# Patient Record
Sex: Male | Born: 1985 | Race: Black or African American | Hispanic: No | Smoking: Never smoker
Health system: Southern US, Community
[De-identification: ages and names within clinical notes are randomized; demographics above are authoritative.]

## PROBLEM LIST (undated history)

## (undated) DIAGNOSIS — T7840XA Allergy, unspecified, initial encounter: Secondary | ICD-10-CM

## (undated) HISTORY — DX: Allergy, unspecified, initial encounter: T78.40XA

---

## 2012-06-14 ENCOUNTER — Ambulatory Visit (INDEPENDENT_AMBULATORY_CARE_PROVIDER_SITE_OTHER): Payer: 59 | Admitting: Internal Medicine

## 2012-06-14 VITALS — BP 148/97 | HR 87 | Temp 97.4°F | Resp 16 | Ht 73.0 in | Wt 311.0 lb

## 2012-06-14 DIAGNOSIS — J020 Streptococcal pharyngitis: Secondary | ICD-10-CM

## 2012-06-14 DIAGNOSIS — J039 Acute tonsillitis, unspecified: Secondary | ICD-10-CM

## 2012-06-14 MED ORDER — AMOXICILLIN 500 MG PO CAPS: 1000.0000 mg | ORAL_CAPSULE | Freq: Two times a day (BID) | ORAL | Status: DC

## 2012-06-14 NOTE — Progress Notes (Signed)
  Subjective:    Patient ID: Jack Rose, male    DOB: 13-Aug-1985, 27 y.o.   MRN: [id number]  HPI Has fever and st for 2 days. No chest sxs. Has 79 month old baby.   Review of Systems     Objective:   Physical Exam  Constitutional: He is oriented to person, place, and time. He appears well-developed and well-nourished.  HENT:  Right Ear: External ear normal.  Left Ear: External ear normal.  Nose: Nose normal.  Mouth/Throat: Oropharyngeal exudate present.  Eyes: EOM are normal.  Pulmonary/Chest: Effort normal.  Lymphadenopathy:    He has no cervical adenopathy.  Neurological: He is alert and oriented to person, place, and time.  Psychiatric: He has a normal mood and affect.     Results for orders placed in visit on 06/14/12  POCT RAPID STREP A (OFFICE)      Result Value Range   Rapid Strep A Screen Positive (*) Negative        Assessment & Plan:  Strep tonsillitis Amoxil 1g bid 10days Counseled

## 2012-06-14 NOTE — Patient Instructions (Addendum)

## 2012-06-15 ENCOUNTER — Telehealth: Payer: Self-pay

## 2012-06-15 NOTE — Telephone Encounter (Signed)
Pt would like a note from today through Next Tuesday he does not feel good at all please call him at [phone]

## 2012-06-15 NOTE — Telephone Encounter (Signed)
Note provided for him, he is advised.

## 2013-01-30 ENCOUNTER — Ambulatory Visit: Payer: Self-pay | Admitting: Family Medicine

## 2013-01-30 ENCOUNTER — Ambulatory Visit: Payer: Self-pay

## 2013-01-30 VITALS — BP 162/118 | HR 92 | Temp 98.0°F | Resp 16 | Ht 73.0 in | Wt 326.0 lb

## 2013-01-30 DIAGNOSIS — M79641 Pain in right hand: Secondary | ICD-10-CM

## 2013-01-30 DIAGNOSIS — S60229A Contusion of unspecified hand, initial encounter: Secondary | ICD-10-CM

## 2013-01-30 DIAGNOSIS — M79609 Pain in unspecified limb: Secondary | ICD-10-CM

## 2013-01-30 DIAGNOSIS — S60221A Contusion of right hand, initial encounter: Secondary | ICD-10-CM
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
## 2013-01-30 NOTE — Patient Instructions (Signed)
Hand Contusion  A hand contusion is a deep bruise on your hand area. Contusions are the result of an injury that caused bleeding under the skin. The contusion may turn blue, purple, or yellow. Minor injuries will give you a painless contusion, but more severe contusions may stay painful and swollen for a few weeks.  CAUSES   A contusion is usually caused by a blow, trauma, or direct force to an area of the body.  SYMPTOMS   · Swelling and redness of the injured area.  · Discoloration of the injured area.  · Tenderness and soreness of the injured area.  · Pain.  DIAGNOSIS   The diagnosis can be made by taking a history and performing a physical exam. An X-ray, CT scan, or MRI may be needed to determine if there were any associated injuries, such as broken bones (fractures).  TREATMENT   Often, the best treatment for a hand contusion is resting, elevating, icing, and applying cold compresses to the injured area. Over-the-counter medicines may also be recommended for pain control.  HOME CARE INSTRUCTIONS   · Put ice on the injured area.  · Put ice in a plastic bag.  · Place a towel between your skin and the bag.  · Leave the ice on for 15-20 minutes, 3-4 times a day.  · Only take over-the-counter or prescription medicines as directed by your caregiver. Your caregiver may recommend avoiding anti-inflammatory medicines (aspirin, ibuprofen, and naproxen) for 48 hours because these medicines may increase bruising.  · If told, use an elastic wrap as directed. This can help reduce swelling. You may remove the wrap for sleeping, showering, and bathing. If your fingers become numb, cold, or blue, take the wrap off and reapply it more loosely.  · Elevate your hand with pillows to reduce swelling.  · Avoid overusing your hand if it is painful.  SEEK IMMEDIATE MEDICAL CARE IF:   · You have increased redness, swelling, or pain in your hand.  · Your swelling or pain is not relieved with medicines.  · You have loss of feeling in  your hand or are unable to move your fingers.  · Your hand turns cold or blue.  · You have pain when you move your fingers.  · Your hand becomes warm to the touch.  · Your contusion does not improve in 2 days.  MAKE SURE YOU:   · Understand these instructions.  · Will watch your condition.  · Will get help right away if you are not doing well or get worse.  Document Released: 09/24/2001 Document Revised: 12/28/2011 Document Reviewed: 09/26/2011  ExitCare® Patient Information ©2014 [employer].

## 2013-07-20 ENCOUNTER — Ambulatory Visit: Payer: Self-pay | Admitting: Physician Assistant

## 2013-07-20 VITALS — BP 150/110 | HR 80 | Temp 98.2°F | Resp 20 | Ht 74.25 in | Wt 336.6 lb

## 2013-07-20 DIAGNOSIS — J069 Acute upper respiratory infection, unspecified: Secondary | ICD-10-CM

## 2013-07-20 DIAGNOSIS — B9789 Other viral agents as the cause of diseases classified elsewhere: Secondary | ICD-10-CM

## 2013-07-20 DIAGNOSIS — R0683 Snoring: Secondary | ICD-10-CM

## 2013-07-20 DIAGNOSIS — I10 Essential (primary) hypertension: Secondary | ICD-10-CM

## 2013-07-20 LAB — COMPLETE METABOLIC PANEL WITH GFR
ALT: 31 U/L (ref 0–53)
AST: 21 U/L (ref 0–37)
Albumin: 4.5 g/dL (ref 3.5–5.2)
Alkaline Phosphatase: 64 U/L (ref 39–117)
BUN: 8 mg/dL (ref 6–23)
CALCIUM: 9.9 mg/dL (ref 8.4–10.5)
CHLORIDE: 104 meq/L (ref 96–112)
CO2: 26 mEq/L (ref 19–32)
CREATININE: 1.27 mg/dL (ref 0.50–1.35)
GFR, Est African American: 88 mL/min
GFR, Est Non African American: 76 mL/min
Glucose, Bld: 87 mg/dL (ref 70–99)
POTASSIUM: 4.4 meq/L (ref 3.5–5.3)
Sodium: 139 mEq/L (ref 135–145)
Total Bilirubin: 0.5 mg/dL (ref 0.2–1.2)
Total Protein: 7.8 g/dL (ref 6.0–8.3)

## 2013-07-20 MED ORDER — HYDROCODONE-HOMATROPINE 5-1.5 MG/5ML PO SYRP: 5.0000 mL | ORAL_SOLUTION | Freq: Three times a day (TID) | ORAL | Status: DC | PRN

## 2013-07-20 MED ORDER — GUAIFENESIN ER 1200 MG PO TB12: 1.0000 | ORAL_TABLET | Freq: Two times a day (BID) | ORAL | Status: AC

## 2013-07-20 MED ORDER — HYDROCOD POLST-CHLORPHEN POLST 10-8 MG/5ML PO LQCR: 5.0000 mL | Freq: Two times a day (BID) | ORAL | Status: DC

## 2013-07-20 MED ORDER — TRIAMCINOLONE ACETONIDE 55 MCG/ACT NA AERO: 2.0000 | INHALATION_SPRAY | Freq: Every day | NASAL | Status: DC

## 2013-07-20 MED ORDER — LISINOPRIL-HYDROCHLOROTHIAZIDE 20-12.5 MG PO TABS: 1.0000 | ORAL_TABLET | Freq: Every day | ORAL | Status: DC
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
## 2013-07-20 NOTE — Patient Instructions (Signed)
Recheck with me in about 2 weeks.  I will see if I can get you an appt - they will call you for that appt - if I cannot see you in an appt they will give you my hours so you can f/u with me. We have done a referral for a sleep study to diagnosis possible sleep apnea.  I will contact you with your lab results as soon as they are available.   If you have not heard from me in 2 weeks, please contact me.  The fastest way to get your results is to register for My Chart (see the instructions on the last page of this printout).

## 2014-03-26 ENCOUNTER — Ambulatory Visit (INDEPENDENT_AMBULATORY_CARE_PROVIDER_SITE_OTHER): Payer: 59 | Admitting: Physician Assistant

## 2014-03-26 VITALS — BP 164/120 | HR 77 | Temp 98.5°F | Resp 18 | Ht 73.5 in | Wt 339.2 lb

## 2014-03-26 DIAGNOSIS — Z23 Encounter for immunization: Secondary | ICD-10-CM

## 2014-03-26 DIAGNOSIS — J069 Acute upper respiratory infection, unspecified: Secondary | ICD-10-CM

## 2014-03-26 DIAGNOSIS — I10 Essential (primary) hypertension: Secondary | ICD-10-CM

## 2014-03-26 MED ORDER — LISINOPRIL-HYDROCHLOROTHIAZIDE 20-12.5 MG PO TABS: 1.0000 | ORAL_TABLET | Freq: Every day | ORAL | Status: DC

## 2014-03-26 MED ORDER — IPRATROPIUM BROMIDE 0.03 % NA SOLN: 2.0000 | Freq: Two times a day (BID) | NASAL | Status: DC

## 2014-03-26 MED ORDER — MAGIC MOUTHWASH W/LIDOCAINE: 10.0000 mL | ORAL | Status: DC | PRN

## 2014-03-26 NOTE — Patient Instructions (Signed)
Upper Respiratory Infection, Adult An upper respiratory infection (URI) is also sometimes known as the common cold. The upper respiratory tract includes the nose, sinuses, throat, trachea, and bronchi. Bronchi are the airways leading to the lungs. Most people improve within 1 week, but symptoms can last up to 2 weeks. A residual cough may last even longer.  CAUSES Many different viruses can infect the tissues lining the upper respiratory tract. The tissues become irritated and inflamed and often become very moist. Mucus production is also common. A cold is contagious. You can easily spread the virus to others by oral contact. This includes kissing, sharing a glass, coughing, or sneezing. Touching your mouth or nose and then touching a surface, which is then touched by another person, can also spread the virus. SYMPTOMS  Symptoms typically develop 1 to 3 days after you come in contact with a cold virus. Symptoms vary from person to person. They may include:  Runny nose.  Sneezing.  Nasal congestion.  Sinus irritation.  Sore throat.  Loss of voice (laryngitis).  Cough.  Fatigue.  Muscle aches.  Loss of appetite.  Headache.  Low-grade fever. DIAGNOSIS  You might diagnose your own cold based on familiar symptoms, since most people get a cold 2 to 3 times a year. Your caregiver can confirm this based on your exam. Most importantly, your caregiver can check that your symptoms are not due to another disease such as strep throat, sinusitis, pneumonia, asthma, or epiglottitis. Blood tests, throat tests, and X-rays are not necessary to diagnose a common cold, but they may sometimes be helpful in excluding other more serious diseases. Your caregiver will decide if any further tests are required. RISKS AND COMPLICATIONS  You may be at risk for a more severe case of the common cold if you smoke cigarettes, have chronic heart disease (such as heart failure) or lung disease (such as asthma), or if  you have a weakened immune system. The very young and very old are also at risk for more serious infections. Bacterial sinusitis, middle ear infections, and bacterial pneumonia can complicate the common cold. The common cold can worsen asthma and chronic obstructive pulmonary disease (COPD). Sometimes, these complications can require emergency medical care and may be life-threatening. PREVENTION  The best way to protect against getting a cold is to practice good hygiene. Avoid oral or hand contact with people with cold symptoms. Wash your hands often if contact occurs. There is no clear evidence that vitamin C, vitamin E, echinacea, or exercise reduces the chance of developing a cold. However, it is always recommended to get plenty of rest and practice good nutrition. TREATMENT  Treatment is directed at relieving symptoms. There is no cure. Antibiotics are not effective, because the infection is caused by a virus, not by bacteria. Treatment may include:  Increased fluid intake. Sports drinks offer valuable electrolytes, sugars, and fluids.  Breathing heated mist or steam (vaporizer or shower).  Eating chicken soup or other clear broths, and maintaining good nutrition.  Getting plenty of rest.  Using gargles or lozenges for comfort.  Controlling fevers with ibuprofen or acetaminophen as directed by your caregiver.  Increasing usage of your inhaler if you have asthma. Zinc gel and zinc lozenges, taken in the first 24 hours of the common cold, can shorten the duration and lessen the severity of symptoms. Pain medicines may help with fever, muscle aches, and throat pain. A variety of non-prescription medicines are available to treat congestion and runny nose. Your caregiver   can make recommendations and may suggest nasal or lung inhalers for other symptoms.  HOME CARE INSTRUCTIONS   Only take over-the-counter or prescription medicines for pain, discomfort, or fever as directed by your  caregiver.  Use a warm mist humidifier or inhale steam from a shower to increase air moisture. This may keep secretions moist and make it easier to breathe.  Drink enough water and fluids to keep your urine clear or pale yellow.  Rest as needed.  Return to work when your temperature has returned to normal or as your caregiver advises. You may need to stay home longer to avoid infecting others. You can also use a face mask and careful hand washing to prevent spread of the virus. SEEK MEDICAL CARE IF:   After the first few days, you feel you are getting worse rather than better.  You need your caregiver's advice about medicines to control symptoms.  You develop chills, worsening shortness of breath, or brown or red sputum. These may be signs of pneumonia.  You develop yellow or brown nasal discharge or pain in the face, especially when you bend forward. These may be signs of sinusitis.  You develop a fever, swollen neck glands, pain with swallowing, or white areas in the back of your throat. These may be signs of strep throat. SEEK IMMEDIATE MEDICAL CARE IF:   You have a fever.  You develop severe or persistent headache, ear pain, sinus pain, or chest pain.  You develop wheezing, a prolonged cough, cough up blood, or have a change in your usual mucus (if you have chronic lung disease).  You develop sore muscles or a stiff neck. Document Released: 09/28/2000 Document Revised: 06/27/2011 Document Reviewed: 07/10/2013 ExitCare Patient Information 2015 [employer]. This information is not intended to replace advice given to you by your health care provider. Make sure you discuss any questions you have with your health care provider.  

## 2014-03-26 NOTE — Progress Notes (Signed)
I have discussed this case with Ms. Ricki Miller, PA-C and agree.

## 2014-03-26 NOTE — Progress Notes (Signed)
Subjective:    Patient ID: Jack Rose, male    DOB: [birth year], 28 y.o.   MRN: [id number]  HPI Patient presents with 6 days of productive cough with thick yellow sputum and sore throat. Cough is improving, however, throat feels worse. Sx are accompanied with rhinorrhea and congestion. Denies fever, sinus/ear pressure, or N/V. No h/o asthma and has seasonal allergies. Both girlfriend or child have similar sx. Has tried Theraflu and OTC cold meds with no relief. Denies allergy to any medication.  BP elevated in office. Patient unaware of usual BP readings. Has not taken BP meds since summertime and has not had follow up. No reason for not following up. Has had more headaches over past week, but denies changes in vision, SOB, CP, or edema.   Review of Systems  Constitutional: Positive for fatigue. Negative for fever, activity change and appetite change.  HENT: Positive for congestion, rhinorrhea and sore throat. Negative for ear discharge, ear pain, facial swelling, postnasal drip, sinus pressure, sneezing and trouble swallowing.   Eyes: Negative for pain, redness, itching and visual disturbance.  Respiratory: Positive for cough. Negative for chest tightness, shortness of breath and wheezing.   Cardiovascular: Negative for chest pain, palpitations and leg swelling.  Gastrointestinal: Negative for nausea and vomiting.  Allergic/Immunologic: Positive for environmental allergies. Negative for food allergies.  Neurological: Positive for headaches. Negative for dizziness, weakness and light-headedness.  Hematological: Positive for adenopathy.       Objective:   Physical Exam  Constitutional: He is oriented to person, place, and time. He appears well-developed and well-nourished. No distress.  Blood pressure 164/120, pulse 77, temperature 98.5 F (36.9 C), temperature source Oral, resp. rate 18, height 6' 1.5" (1.867 m), weight 339 lb 3.2 oz (153.86 kg), SpO2 98 %.   HENT:  Head:  Normocephalic and atraumatic.  Right Ear: Tympanic membrane, external ear and ear canal normal.  Left Ear: Tympanic membrane, external ear and ear canal normal.  Nose: Mucosal edema and rhinorrhea (mild erythema) present. No sinus tenderness. Right sinus exhibits no maxillary sinus tenderness and no frontal sinus tenderness. Left sinus exhibits no maxillary sinus tenderness and no frontal sinus tenderness.  Mouth/Throat: Uvula is midline, oropharynx is clear and moist and mucous membranes are normal. No oropharyngeal exudate, posterior oropharyngeal edema or posterior oropharyngeal erythema.  Eyes: Conjunctivae are normal. Pupils are equal, round, and reactive to light. Right eye exhibits no discharge. Left eye exhibits no discharge.  Neck: Normal range of motion. Neck supple. No JVD present. No thyromegaly present.  Cardiovascular: Normal rate, regular rhythm and normal heart sounds.  Exam reveals no gallop and no friction rub.   No murmur heard. Pulmonary/Chest: Effort normal and breath sounds normal. No stridor. No respiratory distress. He has no wheezes. He has no rales. He exhibits no tenderness.  Abdominal: Soft. Bowel sounds are normal. There is no tenderness.  Musculoskeletal: He exhibits no edema.  Lymphadenopathy:    He has cervical adenopathy.  Neurological: He is alert and oriented to person, place, and time.  Skin: Skin is warm and dry. No rash noted. He is not diaphoretic. No erythema. No pallor.       Assessment & Plan:  1. Acute upper respiratory infection - Alum & Mag Hydroxide-Simeth (MAGIC MOUTHWASH W/LIDOCAINE) SOLN; Take 10 mLs by mouth every 2 (two) hours as needed for mouth pain.  Dispense: 360 mL; Refill: 0 - ipratropium (ATROVENT) 0.03 % nasal spray; Place 2 sprays into both nostrils 2 (two) times daily.  Dispense: 30 mL; Refill: 0 - Use Mucinex and increase fluid intake.   2. Need for prophylactic vaccination and inoculation against influenza - Flu Vaccine QUAD 36+  mos IM  3. Essential hypertension Uncontrolled. - lisinopril-hydrochlorothiazide (ZESTORETIC) 20-12.5 MG per tablet; Take 1 tablet by mouth daily.  Dispense: 30 tablet; Refill: 1 - RTC in 4-6 weeks for BP recheck and complete physical.    Alveta Heimlich PA-C  [facility] 03/26/2014 2:43 PM

## 2014-06-20 ENCOUNTER — Other Ambulatory Visit: Payer: Self-pay | Admitting: Physician Assistant

## 2015-01-31 ENCOUNTER — Ambulatory Visit (INDEPENDENT_AMBULATORY_CARE_PROVIDER_SITE_OTHER): Payer: 59 | Admitting: Family Medicine

## 2015-01-31 VITALS — BP 140/110 | HR 79 | Temp 98.4°F | Resp 18 | Ht 73.0 in | Wt 331.5 lb

## 2015-01-31 DIAGNOSIS — H109 Unspecified conjunctivitis: Secondary | ICD-10-CM | POA: Diagnosis not present

## 2015-01-31 DIAGNOSIS — I10 Essential (primary) hypertension: Secondary | ICD-10-CM

## 2015-01-31 MED ORDER — TOBRAMYCIN 0.3 % OP SOLN: 1.0000 [drp] | Freq: Four times a day (QID) | OPHTHALMIC | Status: DC

## 2015-01-31 MED ORDER — LISINOPRIL-HYDROCHLOROTHIAZIDE 20-12.5 MG PO TABS: 1.0000 | ORAL_TABLET | Freq: Every day | ORAL | Status: DC

## 2015-01-31 NOTE — Patient Instructions (Signed)

## 2015-01-31 NOTE — Progress Notes (Signed)
@[facility] @  This chart was scribed for Robyn Haber, MD by Thea Alken, ED Scribe. This patient was seen in room 13 and the patient's care was started at 8:27 AM.  Patient ID: Jack Rose MRN: [id number], DOB: 04-11-1986, 29 y.o. Date of Encounter: 01/31/2015, 8:28 AM  Primary Physician: No primary care provider on file.  Chief Complaint:  Chief Complaint  Patient presents with  . Conjunctivitis    Possible pink eye-letf eye. started around 10 am yesterday. C/O itching & blurred vision    HPI: 29 y.o. year old male with history below presents with an erythematous left eye noticed 1 days ago. Pt states when he woke up yesterday morning his pillow scraped his left eye. He now has occasional teary discharge, slight itchiness and mild blurred vision. states he woke up this morning with crusting to left eye. He is unsure of foreign body but denies feeling as if there is something in left eye. He does not wear contacts. Pt works in [employer], which he states contains a lot of dust.  Pt is also requesting a refill of lisinopril- HCTZ. He has not checked his BP in a couple weeks.  Past Medical History  Diagnosis Date  . Allergy      Home Meds: Prior to Admission medications   Medication Sig Start Date End Date Taking? Authorizing Provider  Alum & Mag Hydroxide-Simeth (MAGIC MOUTHWASH W/LIDOCAINE) SOLN Take 10 mLs by mouth every 2 (two) hours as needed for mouth pain. Patient not taking: Reported on 01/31/2015 03/26/14   Tishira R Brewington, PA-C  HYDROcodone-homatropine (HYCODAN) 5-1.5 MG/5ML syrup Take 5 mLs by mouth every 8 (eight) hours as needed for cough. Patient not taking: Reported on 03/26/2014 07/20/13   Mancel Bale, PA-C  ipratropium (ATROVENT) 0.03 % nasal spray Place 2 sprays into both nostrils 2 (two) times daily. Patient not taking: Reported on 01/31/2015 03/26/14   Tishira R Brewington, PA-C  lisinopril-hydrochlorothiazide (PRINZIDE,ZESTORETIC) 20-12.5 MG per tablet TAKE 1  TABLET BY MOUTH DAILY. Patient not taking: Reported on 01/31/2015 06/20/14   Tishira R Brewington, PA-C  triamcinolone (NASACORT AQ) 55 MCG/ACT AERO nasal inhaler Place 2 sprays into the nose daily. Patient not taking: Reported on 03/26/2014 07/20/13   Mancel Bale, PA-C    Allergies: No Known Allergies  Social History   Social History  . Marital Status: Single    Spouse Name: N/A  . Number of Children: N/A  . Years of Education: N/A   Occupational History  . Not on file.   Social History Main Topics  . Smoking status: Never Smoker   . Smokeless tobacco: Never Used  . Alcohol Use: Yes  . Drug Use: No  . Sexual Activity: Not on file   Other Topics Concern  . Not on file   Social History Narrative     Review of Systems: Constitutional: negative for chills, fever, night sweats, weight changes, or fatigue  HEENT: negative for  hearing loss, congestion, rhinorrhea, ST, epistaxis, or sinus pressure Cardiovascular: negative for chest pain or palpitations Respiratory: negative for hemoptysis, wheezing, shortness of breath, or cough Abdominal: negative for abdominal pain, nausea, vomiting, diarrhea, or constipation Dermatological: negative for rash Neurologic: negative for headache, dizziness, or syncope All other systems reviewed and are otherwise negative with the exception to those above and in the HPI.   Physical Exam: Blood pressure 140/110, pulse 79, temperature 98.4 F (36.9 C), temperature source Oral, resp. rate 18, height 6\' 1"  (1.854 m), weight 331  lb 8 oz (150.367 kg), SpO2 97 %., Body mass index is 43.75 kg/(m^2). General: Well developed, well nourished, in no acute distress. Head: Normocephalic, atraumatic, eyes without discharge, sclera non-icteric, nares are without discharge. Bilateral auditory canals clear, TM's are without perforation, pearly grey and translucent with reflective cone of light bilaterally. Oral cavity moist, posterior pharynx without exudate,  erythema, peritonsillar abscess, or post nasal drip. Left eye- flurosciene is negative. No abrasion. Conjunctiva is diffusely injected with some edema and discharge   Neck: Supple. No thyromegaly. Full ROM. No lymphadenopathy. Lungs: Clear bilaterally to auscultation without wheezes, rales, or rhonchi. Breathing is unlabored. Heart: RRR with S1 S2. No murmurs, rubs, or gallops appreciated. Msk:  Strength and tone normal for age. Extremities/Skin: Warm and dry. No clubbing or cyanosis. No edema. No rashes or suspicious lesions. Neuro: Alert and oriented X 3. Moves all extremities spontaneously. Gait is normal. CNII-XII grossly in tact. Psych:  Responds to questions appropriately with a normal affect.    ASSESSMENT AND PLAN:  29 y.o. year old male with left injected eye  By signing my name below, I, Raven Small, attest that this documentation has been prepared under the direction and in the presence of Robyn Haber, MD.  Electronically Signed: Thea Alken, ED Scribe. 01/31/2015. 8:38 AM. This chart was scribed in my presence and reviewed by me personally.    ICD-9-CM ICD-10-CM   1. Conjunctivitis of left eye 372.30 H10.9 tobramycin (TOBREX) 0.3 % ophthalmic solution  2. Essential hypertension 401.9 I10 lisinopril-hydrochlorothiazide (PRINZIDE,ZESTORETIC) 20-12.5 MG tablet     Signed, Robyn Haber, MD  Signed, Robyn Haber, MD 01/31/2015 8:28 AM

## 2015-05-08 ENCOUNTER — Ambulatory Visit (INDEPENDENT_AMBULATORY_CARE_PROVIDER_SITE_OTHER): Payer: 59 | Admitting: Physician Assistant

## 2015-05-08 VITALS — BP 139/90 | HR 70 | Temp 98.0°F | Resp 16 | Ht 73.5 in | Wt 333.4 lb

## 2015-05-08 DIAGNOSIS — M79671 Pain in right foot: Secondary | ICD-10-CM

## 2015-05-08 DIAGNOSIS — Z114 Encounter for screening for human immunodeficiency virus [HIV]: Secondary | ICD-10-CM

## 2015-05-08 DIAGNOSIS — R0683 Snoring: Secondary | ICD-10-CM

## 2015-05-08 DIAGNOSIS — Z23 Encounter for immunization: Secondary | ICD-10-CM | POA: Diagnosis not present

## 2015-05-08 DIAGNOSIS — I10 Essential (primary) hypertension: Secondary | ICD-10-CM | POA: Diagnosis not present

## 2015-05-08 DIAGNOSIS — K1370 Unspecified lesions of oral mucosa: Secondary | ICD-10-CM | POA: Diagnosis not present

## 2015-05-08 DIAGNOSIS — E66813 Obesity, class 3: Secondary | ICD-10-CM

## 2015-05-08 LAB — COMPLETE METABOLIC PANEL WITH GFR
ALT: 25 U/L (ref 9–46)
AST: 19 U/L (ref 10–40)
Albumin: 4.7 g/dL (ref 3.6–5.1)
Alkaline Phosphatase: 63 U/L (ref 40–115)
BUN: 12 mg/dL (ref 7–25)
CALCIUM: 9.9 mg/dL (ref 8.6–10.3)
CO2: 28 mmol/L (ref 20–31)
Chloride: 101 mmol/L (ref 98–110)
Creat: 1.36 mg/dL — ABNORMAL HIGH (ref 0.60–1.35)
GFR, EST NON AFRICAN AMERICAN: 69 mL/min (ref >=60)
GFR, Est African American: 80 mL/min (ref >=60)
Glucose, Bld: 97 mg/dL (ref 65–99)
POTASSIUM: 4.3 mmol/L (ref 3.5–5.3)
Sodium: 140 mmol/L (ref 135–146)
Total Bilirubin: 0.5 mg/dL (ref 0.2–1.2)
Total Protein: 8 g/dL (ref 6.1–8.1)

## 2015-05-08 LAB — POCT GLYCOSYLATED HEMOGLOBIN (HGB A1C): HEMOGLOBIN A1C: 5.9

## 2015-05-08 MED ORDER — AMLODIPINE BESYLATE 10 MG PO TABS: 10.0000 mg | ORAL_TABLET | Freq: Every day | ORAL | Status: DC

## 2015-05-08 MED ORDER — LISINOPRIL-HYDROCHLOROTHIAZIDE 20-12.5 MG PO TABS: 1.0000 | ORAL_TABLET | Freq: Every day | ORAL | Status: DC

## 2015-05-08 MED ORDER — BD ASSURE BPM/AUTO WRIST CUFF MISC: 1.0000 | Freq: Every day | Status: AC

## 2015-05-08 NOTE — Patient Instructions (Signed)
I will contact you with your lab results as soon as they are available.   If you have not heard from me in 2 weeks, please contact me.  The fastest way to get your results is to register for My Chart (see the instructions on the last page of this printout).   

## 2015-05-08 NOTE — Progress Notes (Signed)
Jack Rose  MRN: [id number] DOB: [birth year]  Subjective:  Pt presents to clinic for recheck of his medical problems 1- needs refills of his HTN medications - he does not check his BP at home 2- he never for a call about his sleep study - he has over the last month been waking up with headaches 3- wants to make sure he does not have DM - he had some foot pain last night without injury and when he googled it DM was mentioned - he has noticed he has been more thirsty than normal but otherwise he has no other DM symptoms - he also had an episode of dizziness last week that lasted for only a short while and then resolved - it was after he had not eaten in a long time and then ate a sweet bun and he developed feeling like he was going to pass out but never did and then in a short time it resolved - he has not had it since 4- feels like there is something in his throat over the last week that will not go away - he has no pain and no trouble swallowing and no change in his voice  Patient Active Problem List   Diagnosis Date Noted  . Obesity, Class III, BMI 40-49.9 (morbid obesity) ([place]) 05/08/2015  . Benign essential HTN 05/08/2015    No current outpatient prescriptions on file prior to visit.   No current facility-administered medications on file prior to visit.    No Known Allergies  Review of Systems  Constitutional: Negative for fever and chills.  HENT: Negative for congestion and sore throat.   Musculoskeletal: Negative for gait problem.  Neurological: Positive for headaches.   Objective:  BP 139/90 mmHg  Pulse 70  Temp(Src) 98 F (36.7 C) (Oral)  Resp 16  Ht 6' 1.5" (1.867 m)  Wt 333 lb 6.4 oz (151.229 kg)  BMI 43.39 kg/m2  SpO2 98%  Physical Exam  Constitutional: He is oriented to person, place, and time and well-developed, well-nourished, and in no distress.     HENT:  Head: Normocephalic and atraumatic.  Right Ear: Hearing, tympanic membrane, external ear and ear  canal normal.  Left Ear: Hearing, tympanic membrane, external ear and ear canal normal.  Nose: Nose normal.  Mouth/Throat: Uvula is midline, oropharynx is clear and moist and mucous membranes are normal. Oral lesions (right lower part of uvula - warty aoppearing lesion ~1/2cm in size) present.  Eyes: Conjunctivae are normal.  Neck: Normal range of motion.  Cardiovascular: Normal rate, regular rhythm, normal heart sounds and intact distal pulses.   Pulmonary/Chest: Effort normal and breath sounds normal. He has no wheezes.  Musculoskeletal:       Right lower leg: He exhibits no edema.       Left lower leg: He exhibits no edema.       Right foot: There is tenderness (anterior ankle). There is normal range of motion.  Feeling of stretching with dorsiflexion  Lymphadenopathy:       Head (right side): No tonsillar adenopathy present.       Head (left side): No tonsillar adenopathy present.    He has no cervical adenopathy.       Right: No supraclavicular adenopathy present.       Left: No supraclavicular adenopathy present.  Neurological: He is alert and oriented to person, place, and time. He has normal sensation, normal strength and normal reflexes. Gait normal. Gait normal.  Skin: Skin  is warm and dry.  Psychiatric: Mood, memory, affect and judgment normal.    Assessment and Plan :  Snoring - Plan: Nocturnal polysomnography (NPSG) - I am unsure why the sleep study was never ordered but I have ordered it today - I think it is suspect for sleep apnea needing CPAP   Essential hypertension - Plan: amLODipine (NORVASC) 10 MG tablet, Care order/instruction, Care order/instruction, lisinopril-hydrochlorothiazide (PRINZIDE,ZESTORETIC) 20-12.5 MG tablet,  - uncontrolled - we will add norvasc but I suspect patient has sleep apnea that once that is treated his BP might be more controlled  Obesity, Class III, BMI 40-49.9 (morbid obesity) (HCC) - Plan: POCT glycosylated hemoglobin (Hb A1C) - no sign of  DM today -   Benign essential HTN - Plan: Nocturnal polysomnography (NPSG), COMPLETE METABOLIC PANEL WITH GFR, TSH  Flu vaccine need - Plan: Flu Vaccine QUAD 36+ mos IM  Need for Tdap vaccination - Plan: Tdap vaccine greater than or equal to 7yo IM  Lesion of uvula - appears to be a wart - Plan: Ambulatory referral to ENT  Screening for HIV (human immunodeficiency virus) - Plan: HIV antibody  Foot pain, right - this is likely to be a strain - he will watch and wait and use OTC NSAIDs  Windell Hummingbird PA-C  [facility] 05/08/2015 1:43 PM

## 2015-05-09 ENCOUNTER — Encounter: Payer: Self-pay | Admitting: Physician Assistant

## 2015-05-09 LAB — HIV ANTIBODY (ROUTINE TESTING W REFLEX): HIV 1&2 Ab, 4th Generation: NONREACTIVE

## 2015-05-09 LAB — TSH: TSH: 1.336 u[IU]/mL (ref 0.350–4.500)

## 2015-05-23 IMAGING — CR DG HAND COMPLETE 3+V*R*
3 series · 3 of 3 positions shown · non-contrast
Comparison: None.

CLINICAL DATA: Trauma and pain.

EXAM:
RIGHT HAND - COMPLETE 3+ VIEW

[PA]
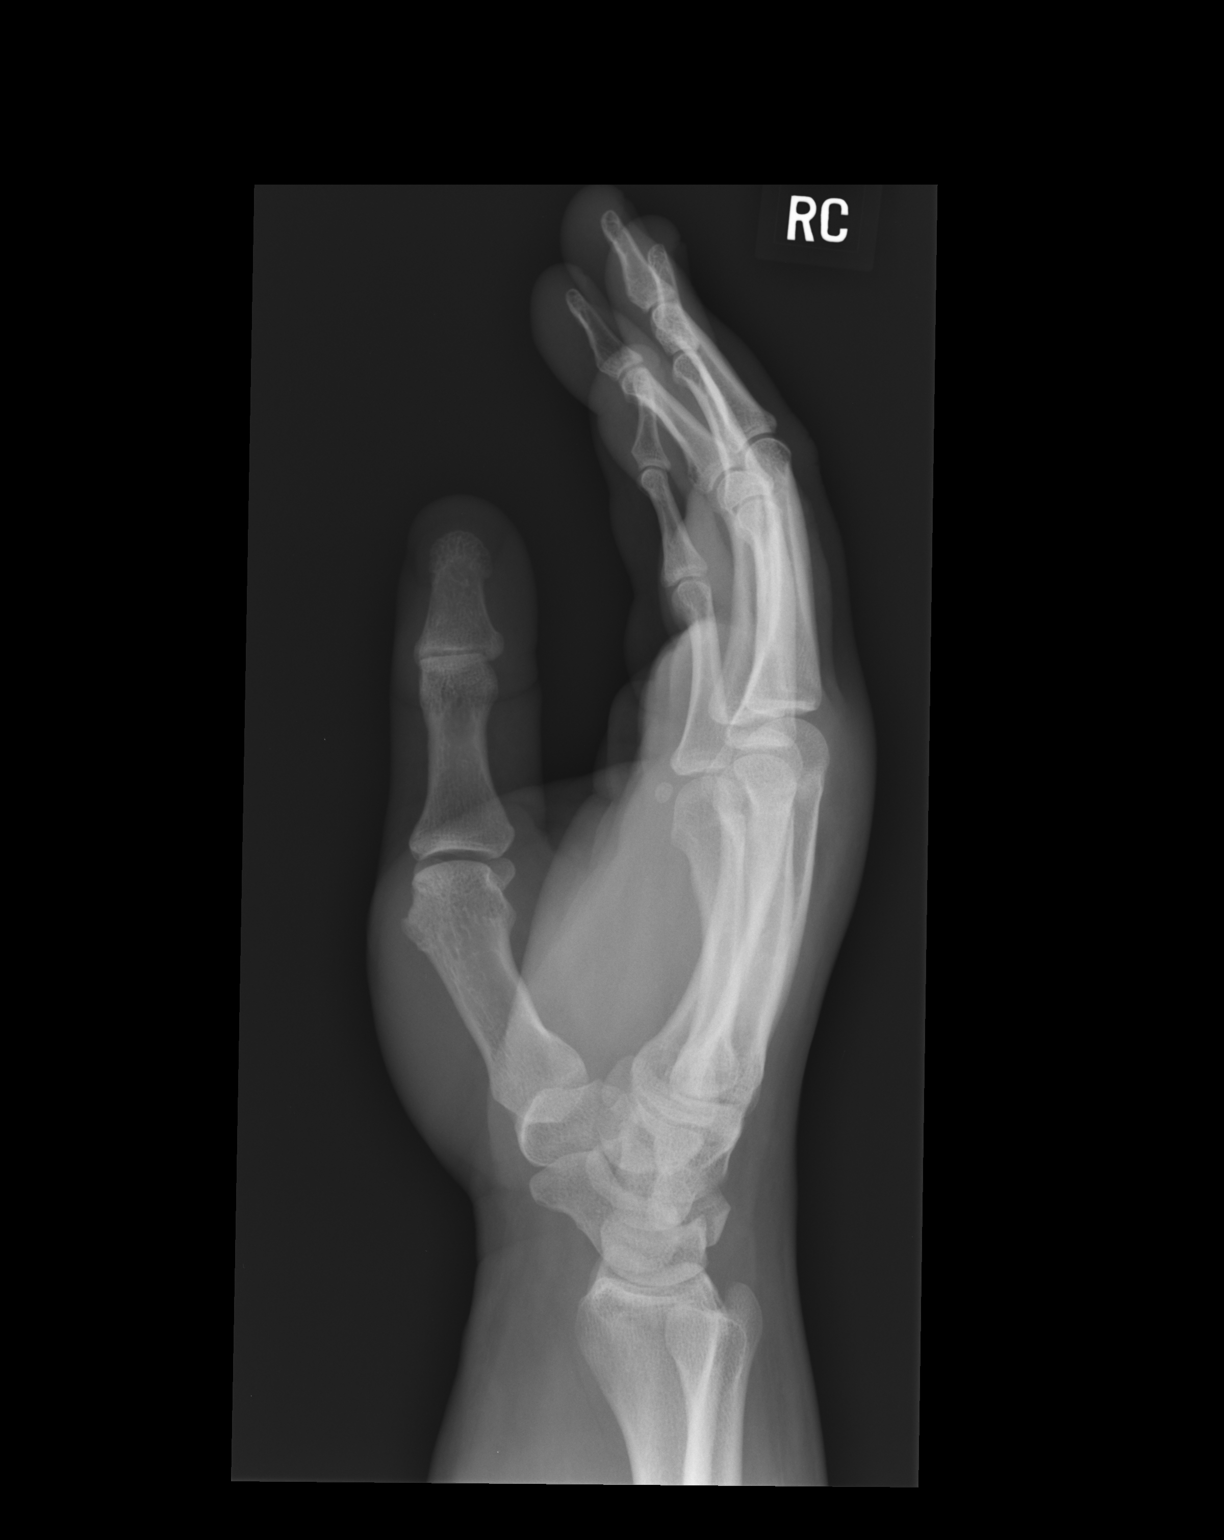

[lateral]
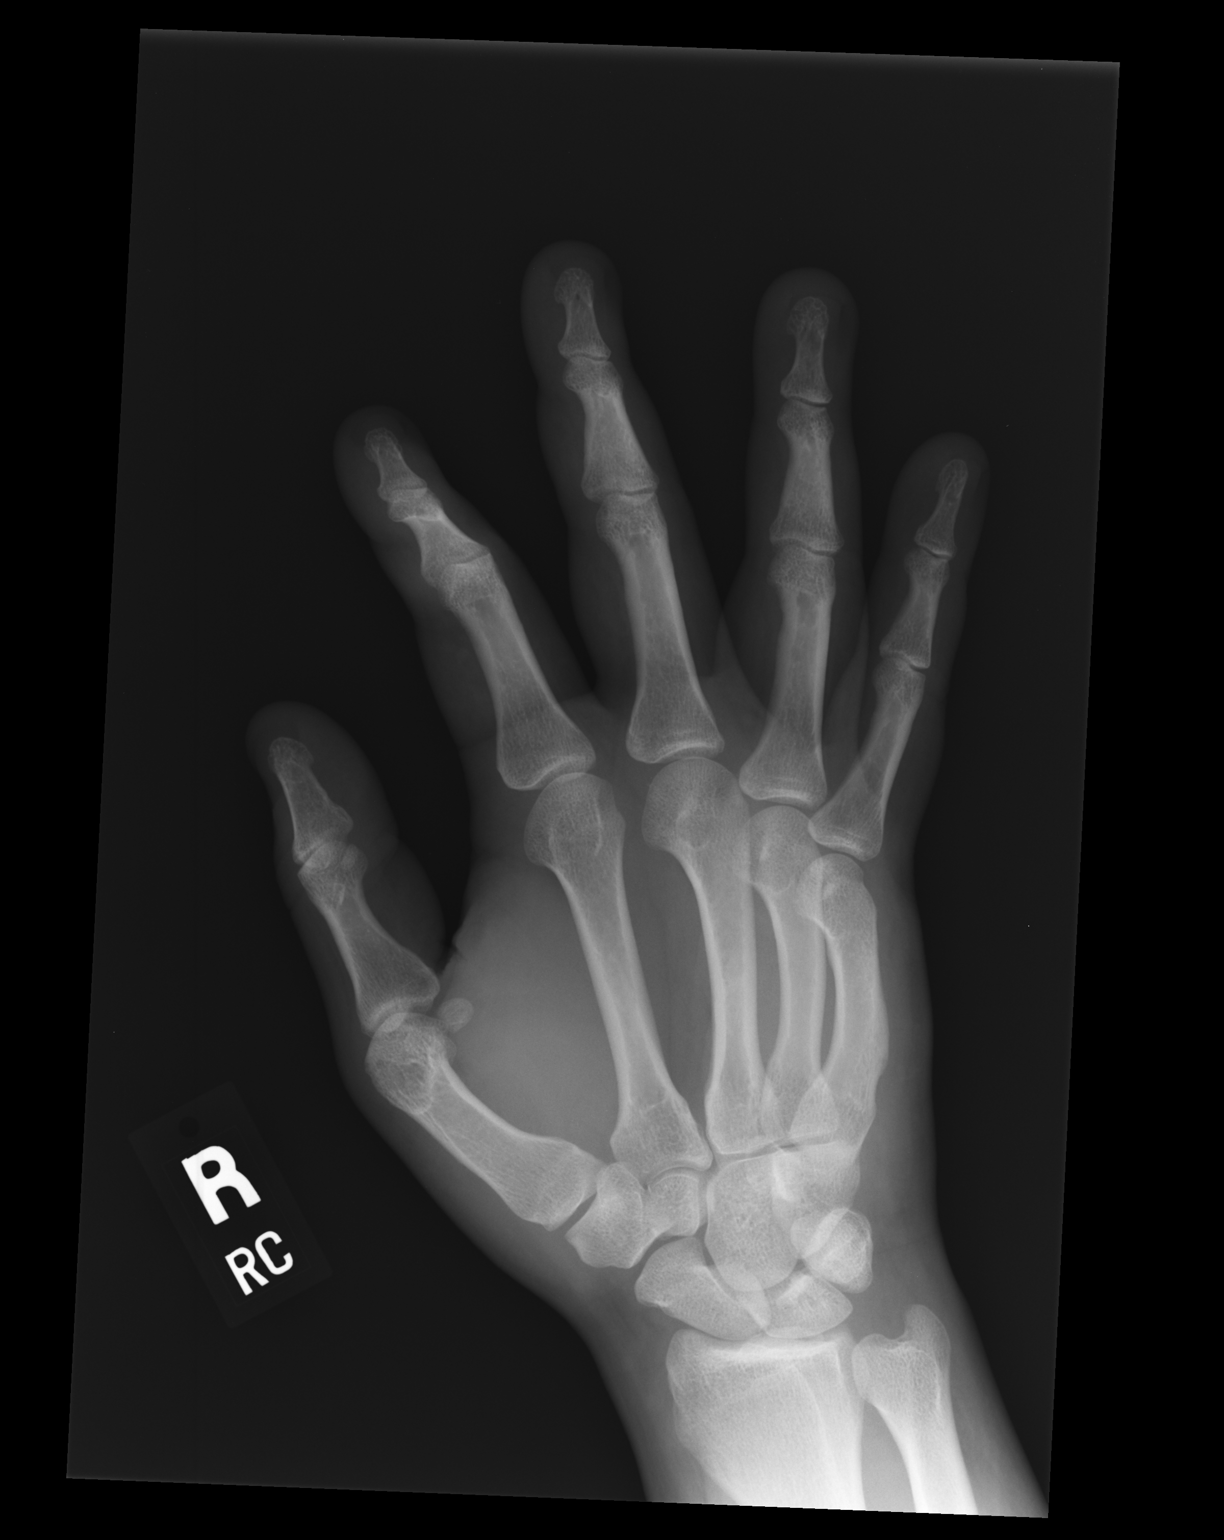

[pa obl]
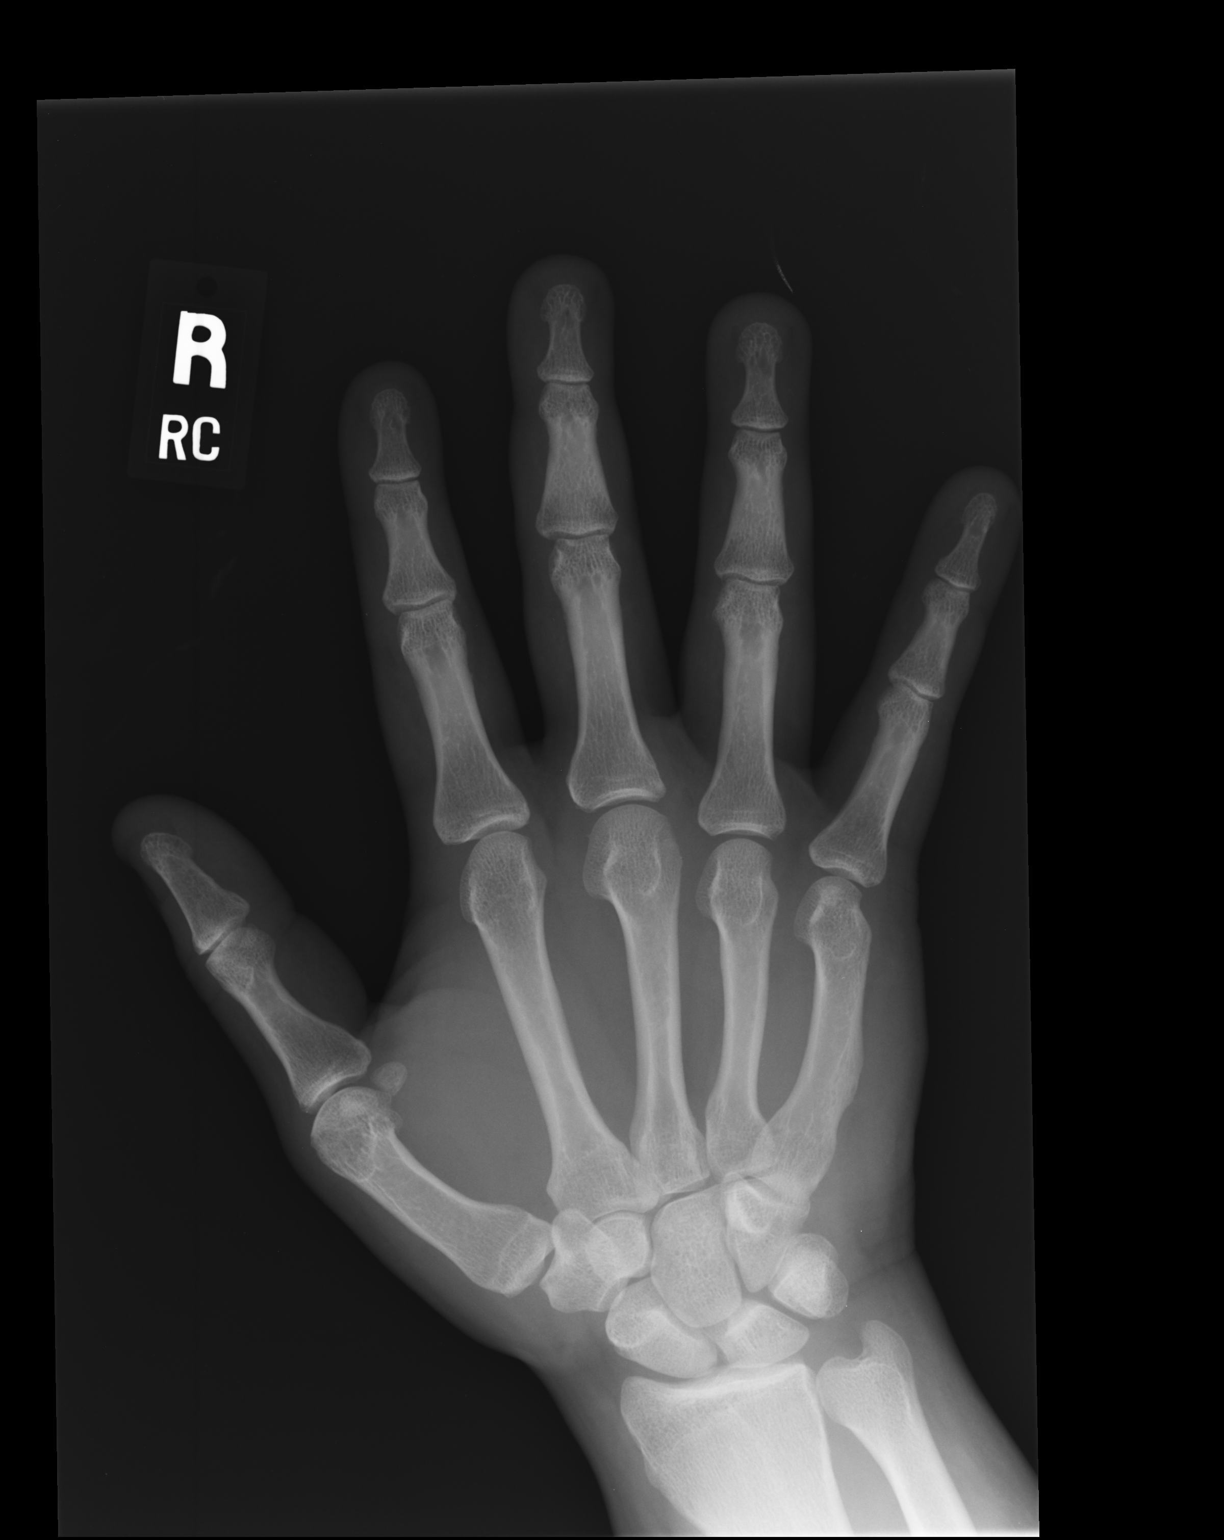

[3 of 3 positions shown; findings below may reference images not displayed]

FINDINGS: The lateral view is mildly degraded secondary to overlap of fingers.
Dorsal soft tissue swelling is identified at the level of the distal
metacarpals.

There is a deformity of the 5th metacarpal, most consistent with
remote trauma. No acute fracture or dislocation.
IMPRESSION: Dorsal soft tissue swelling, without acute osseous abnormality.

Remote trauma to the 5th metacarpal.

## 2015-06-29 DIAGNOSIS — D105 Benign neoplasm of other parts of oropharynx: Secondary | ICD-10-CM | POA: Insufficient documentation

## 2015-08-13 ENCOUNTER — Other Ambulatory Visit: Payer: Self-pay | Admitting: Physician Assistant

## 2015-11-24 ENCOUNTER — Other Ambulatory Visit: Payer: Self-pay | Admitting: Physician Assistant

## 2016-04-01 ENCOUNTER — Other Ambulatory Visit: Payer: Self-pay | Admitting: Physician Assistant

## 2016-05-14 ENCOUNTER — Ambulatory Visit (INDEPENDENT_AMBULATORY_CARE_PROVIDER_SITE_OTHER): Payer: 59 | Admitting: Urgent Care

## 2016-05-14 VITALS — BP 136/88 | HR 86 | Temp 98.5°F | Resp 18 | Ht 73.5 in | Wt 337.0 lb

## 2016-05-14 DIAGNOSIS — R7303 Prediabetes: Secondary | ICD-10-CM | POA: Diagnosis not present

## 2016-05-14 DIAGNOSIS — E669 Obesity, unspecified: Secondary | ICD-10-CM | POA: Diagnosis not present

## 2016-05-14 DIAGNOSIS — R21 Rash and other nonspecific skin eruption: Secondary | ICD-10-CM | POA: Diagnosis not present

## 2016-05-14 LAB — GLUCOSE, POCT (MANUAL RESULT ENTRY): POC GLUCOSE: 103 mg/dL — AB (ref 70–99)

## 2016-05-14 MED ORDER — PREDNISONE 20 MG PO TABS: ORAL_TABLET | ORAL | 0 refills | Status: DC

## 2016-05-14 NOTE — Patient Instructions (Addendum)
Rash A rash is a change in the color of the skin. A rash can also change the way your skin feels. There are many different conditions and factors that can cause a rash. Follow these instructions at home: Pay attention to any changes in your symptoms. Follow these instructions to help with your condition: Medicine Take or apply over-the-counter and prescription medicines only as told by your health care provider. These may include:  Corticosteroid cream.  Anti-itch lotions.  Oral antihistamines.  Skin Care  Apply cool compresses to the affected areas.  Try taking a bath with: ? Epsom salts. Follow the instructions on the packaging. You can get these at your local pharmacy or grocery store. ? Baking soda. Pour a small amount into the bath as told by your health care provider. ? Colloidal oatmeal. Follow the instructions on the packaging. You can get this at your local pharmacy or grocery store.  Try applying baking soda paste to your skin. Stir water into baking soda until it reaches a paste-like consistency.  Do not scratch or rub your skin.  Avoid covering the rash. Make sure the rash is exposed to air as much as possible. General instructions  Avoid hot showers or baths, which can make itching worse. A cold shower may help.  Avoid scented soaps, detergents, and perfumes. Use gentle soaps, detergents, perfumes, and other cosmetic products.  Avoid any substance that causes your rash. Keep a journal to help track what causes your rash. Write down: ? What you eat. ? What cosmetic products you use. ? What you drink. ? What you wear. This includes jewelry.  Keep all follow-up visits as told by your health care provider. This is important. Contact a health care provider if:  You sweat at night.  You lose weight.  You urinate more than normal.  You feel weak.  You vomit.  Your skin or the whites of your eyes look yellow (jaundice).  Your skin: ? Tingles. ? Is  numb.  Your rash: ? Does not go away after several days. ? Gets worse.  You are: ? Unusually thirsty. ? More tired than normal.  You have: ? New symptoms. ? Pain in your abdomen. ? A fever. ? Diarrhea. Get help right away if:  You develop a rash that covers all or most of your body. The rash may or may not be painful.  You develop blisters that: ? Are on top of the rash. ? Grow larger or grow together. ? Are painful. ? Are inside your nose or mouth.  You develop a rash that: ? Looks like purple pinprick-sized spots all over your body. ? Has a "bull's eye" or looks like a target. ? Is not related to sun exposure, is red and painful, and causes your skin to peel. This information is not intended to replace advice given to you by your health care provider. Make sure you discuss any questions you have with your health care provider. Document Released: 03/25/2002 Document Revised: 09/08/2015 Document Reviewed: 08/20/2014 Elsevier Interactive Patient Education  2017 Elsevier Inc.     IF you received an x-ray today, you will receive an invoice from [facility]. Please contact [facility] at [phone] with questions or concerns regarding your invoice.   IF you received labwork today, you will receive an invoice from [facility]. Please contact [facility] at [phone] with questions or concerns regarding your invoice.   Our billing staff will not be able to assist you with questions regarding bills from these   companies.  You will be contacted with the lab results as soon as they are available. The fastest way to get your results is to activate your My Chart account. Instructions are located on the last page of this paperwork. If you have not heard from [place] regarding the results in 2 weeks, please contact this office.

## 2016-05-14 NOTE — Progress Notes (Signed)
    MRN: [id number] DOB: 1985/11/18  Subjective:   Jack Rose is a 31 y.o. male presenting for chief complaint of Rash (INNER THIGHS AND LEGS AND BACK)  Reports 2 week history of worsening rash over his inner thighs. Has been using steroid cream with some improvement but now has rash spreading over his left calf and back. The rash is primarily itchy, worse at night. Denies fever, sore throat, chest pain, n/v, abdominal pain, genital rash, dysuria. Has not started new medications, foods, switched hygiene products, cleaning agents, spent significant time outdoors. He did just move into a newly built home. Patient has a history of pre-diabetes but denies history of liver disease. HIV test in 04/2015 was negative.  Jack Rose has a current medication list which includes the following prescription(s): b-d assure bpm/auto wrist cuff, lisinopril-hydrochlorothiazide, and amlodipine. Also has No Known Allergies.  Jack Rose  has a past medical history of Allergy. Also denies past surgical history.   Objective:   Vitals: BP 136/88 (BP Location: Right Arm, Patient Position: Sitting, Cuff Size: Large)   Pulse 86   Temp 98.5 F (36.9 C) (Oral)   Resp 18   Ht 6' 1.5" (1.867 m)   Wt (!) 337 lb (152.9 kg)   SpO2 98%   BMI 43.86 kg/m   Physical Exam  Constitutional: He is oriented to person, place, and time. He appears well-developed and well-nourished.  HENT:  Mouth/Throat: Oropharynx is clear and moist.  No facial swelling.  Eyes: Right eye exhibits no discharge. Left eye exhibits no discharge. No scleral icterus.  Neck: Normal range of motion. Neck supple.  Cardiovascular: Normal rate, regular rhythm and intact distal pulses.  Exam reveals no gallop and no friction rub.   No murmur heard. Pulmonary/Chest: Effort normal. No respiratory distress. He has no wheezes. He has no rales.  Lymphadenopathy:    He has no cervical adenopathy.  Neurological: He is alert and oriented to person, place, and time.    Skin: Skin is warm and dry. Rash (large patches of scaly pruritic hyperpigmented rash over thighs, left calf and upper back) noted.   Results for orders placed or performed in visit on 05/14/16 (from the past 24 hour(s))  POCT glucose (manual entry)     Status: Abnormal   Collection Time: 05/14/16  3:46 PM  Result Value Ref Range   POC Glucose 103 (A) 70 - 99 mg/dl    Assessment and Plan :   This case was precepted with Dr. Mitchel Honour.   1. Rash and nonspecific skin eruption - Will manage as an allergic dermatitis to unknown offending agent. Patient is to start 9 day course of steroids. RTC if rash worsens, patient is to stop prednisone if rash worsens.  2. Obesity, unspecified classification, unspecified obesity type, unspecified whether serious comorbidity present 3. Pre-diabetes - Stable, checked for the steroid course we were prescribing.  Jaynee Eagles, PA-C [facility] [id number] 05/14/2016  3:30 PM

## 2016-06-13 ENCOUNTER — Telehealth: Payer: Self-pay

## 2016-06-13 NOTE — Telephone Encounter (Signed)
lvm to reschedule pt for tomorrow 06/14/16

## 2016-06-14 ENCOUNTER — Ambulatory Visit (INDEPENDENT_AMBULATORY_CARE_PROVIDER_SITE_OTHER): Payer: 59 | Admitting: Physician Assistant

## 2016-06-14 ENCOUNTER — Ambulatory Visit: Payer: 59

## 2016-06-14 ENCOUNTER — Encounter: Payer: Self-pay | Admitting: Physician Assistant

## 2016-06-14 VITALS — BP 150/100 | HR 86 | Temp 98.7°F | Resp 16 | Ht 73.0 in | Wt 335.0 lb

## 2016-06-14 DIAGNOSIS — B86 Scabies: Secondary | ICD-10-CM | POA: Diagnosis not present

## 2016-06-14 DIAGNOSIS — I10 Essential (primary) hypertension: Secondary | ICD-10-CM

## 2016-06-14 MED ORDER — AMLODIPINE BESYLATE 10 MG PO TABS: 10.0000 mg | ORAL_TABLET | Freq: Every day | ORAL | 0 refills | Status: DC

## 2016-06-14 MED ORDER — LISINOPRIL-HYDROCHLOROTHIAZIDE 20-12.5 MG PO TABS: 1.0000 | ORAL_TABLET | Freq: Every day | ORAL | 0 refills | Status: DC

## 2016-06-14 MED ORDER — AMLODIPINE BESYLATE 10 MG PO TABS: 10.0000 mg | ORAL_TABLET | Freq: Every day | ORAL | 1 refills | Status: DC

## 2016-06-14 MED ORDER — IVERMECTIN 3 MG PO TABS: ORAL_TABLET | ORAL | 0 refills | Status: DC

## 2016-06-14 MED ORDER — LISINOPRIL-HYDROCHLOROTHIAZIDE 20-12.5 MG PO TABS: 1.0000 | ORAL_TABLET | Freq: Every day | ORAL | 1 refills | Status: DC

## 2016-06-14 NOTE — Progress Notes (Signed)
   Jack Rose  MRN: [id number] DOB: February 12, 1986  Subjective:  Pt presents to clinic with concern for scabies.  Fiance was diagnosed yesterday with scabies.  He was treated with prednisone but that did not seem to make a difference - still continuing to get new spots.  Very itchy.  Ran out of his BP medications within the last couple of weeks he has had decreased consistency with his medication use.  Does not check his BP at home.  Review of Systems  Respiratory: Negative for cough and shortness of breath.   Cardiovascular: Negative for chest pain, palpitations and leg swelling.    Patient Active Problem List   Diagnosis Date Noted  . Obesity, Class III, BMI 40-49.9 (morbid obesity) ([place]) 05/08/2015  . Benign essential HTN 05/08/2015    Current Outpatient Prescriptions on File Prior to Visit  Medication Sig Dispense Refill  . Blood Pressure Monitoring (B-D ASSURE BPM/AUTO WRIST CUFF) MISC 1 Device by Does not apply route daily. 1 each 0   No current facility-administered medications on file prior to visit.     No Known Allergies  Pt patients past, family and social history were reviewed and updated.   Objective:  BP (!) 150/100   Pulse 86   Temp 98.7 F (37.1 C) (Oral)   Resp 16   Ht '6\' 1"'$  (1.854 m)   Wt (!) 335 lb (152 kg)   SpO2 98%   BMI 44.20 kg/m   Physical Exam  Constitutional: He is oriented to person, place, and time and well-developed, well-nourished, and in no distress.  HENT:  Head: Normocephalic and atraumatic.  Right Ear: External ear normal.  Left Ear: External ear normal.  Eyes: Conjunctivae are normal.  Neck: Normal range of motion.  Cardiovascular: Normal rate, regular rhythm, normal heart sounds and intact distal pulses.   Pulmonary/Chest: Effort normal and breath sounds normal. He has no wheezes.  Musculoskeletal:       Right lower leg: He exhibits no edema.       Left lower leg: He exhibits no edema.  Neurological: He is alert and oriented to  person, place, and time. Gait normal.  Skin: Skin is warm and dry.  Interdigital space - multiple papules that are excoriated with surrounding dry skin- some channeled - bilateral thighs hyperpigmented dry spots - seem to be healing skin - no erythema present  Psychiatric: Mood, memory, affect and judgment normal.    Assessment and Plan :  Scabies - Plan: ivermectin (STROMECTOL) 3 MG TABS tablet - d/w pt how to treat his house and other family members were treated  Essential hypertension - Plan: CMP14+EGFR, lisinopril-hydrochlorothiazide (PRINZIDE,ZESTORETIC) 20-12.5 MG tablet, amLODipine (NORVASC) 10 MG tablet, DISCONTINUED: lisinopril-hydrochlorothiazide (PRINZIDE,ZESTORETIC) 20-12.5 MG tablet, DISCONTINUED: amLODipine (NORVASC) 10 MG tablet - d/w pt that consistent use of medication is important - he was seen last month while on the medications and his BP was controlled at that visit.  Windell Hummingbird PA-C  [facility] 06/14/2016 9:40 PM

## 2016-06-14 NOTE — Patient Instructions (Addendum)
Scabies, Adult Scabies is a skin condition that happens when very small insects get under the skin (infestation). This causes a rash and severe itchiness. Scabies can spread from person to person (is contagious). If you get scabies, it is common for others in your household to get scabies too. With proper treatment, symptoms usually go away in 2-4 weeks. Scabies usually does not cause lasting problems. What are the causes? This condition is caused by mites (Sarcoptes scabiei, or human itch mites) that can only be seen with a microscope. The mites get into the top layer of skin and lay eggs. Scabies can spread from person to person through:  Close contact with a person who has scabies.  Contact with infested items, such as towels, bedding, or clothing. What increases the risk? This condition is more likely to develop in:  People who live in nursing homes and other extended-care facilities.  People who have sexual contact with a partner who has scabies.  Young children who attend child care facilities.  People who care for others who are at increased risk for scabies. What are the signs or symptoms? Symptoms of this condition may include:  Severe itchiness. This is often worse at night.  A rash that includes tiny red bumps or blisters. The rash commonly occurs on the wrist, elbow, armpit, fingers, waist, groin, or buttocks. Bumps may form a line (burrow) in some areas.  Skin irritation. This can include scaly patches or sores. How is this diagnosed? This condition is diagnosed with a physical exam. Your health care provider will look closely at your skin. In some cases, your health care provider may take a sample of your affected skin (skin scraping) and have it examined under a microscope. How is this treated? This condition may be treated with:  Medicated cream or lotion that kills the mites. This is spread on the entire body and left on for several hours. Usually, one treatment with  medicated cream or lotion is enough to kill all of the mites. In severe cases, the treatment may be repeated.  Medicated cream that relieves itching.  Medicines that help to relieve itching.  Medicines that kill the mites. This treatment is rarely used. Follow these instructions at home:   Medicines   Take or apply over-the-counter and prescription medicines as told by your health care provider.  Apply medicated cream or lotion as told by your health care provider.  Do not wash off the medicated cream or lotion until the necessary amount of time has passed. Skin Care   Avoid scratching your affected skin.  Keep your fingernails closely trimmed to reduce injury from scratching.  Take cool baths or apply cool washcloths to help reduce itching. General instructions   Clean all items that you recently had contact with, including bedding, clothing, and furniture. Do this on the same day that your treatment starts.  Use hot water when you wash items.  Place unwashable items into closed, airtight plastic bags for at least 3 days. The mites cannot live for more than 3 days away from human skin.  Vacuum furniture and mattresses that you use.  Make sure that other people who may have been infested are examined by a health care provider. These include members of your household and anyone who may have had contact with infested items.  Keep all follow-up visits as told by your health care provider. This is important. Contact a health care provider if:  You have itching that does not go away   after 4 weeks of treatment.  You continue to develop new bumps or burrows.  You have redness, swelling, or pain in your rash area after treatment.  You have fluid, blood, or pus coming from your rash. This information is not intended to replace advice given to you by your health care provider. Make sure you discuss any questions you have with your health care provider. Document Released:  12/24/2014 Document Revised: 09/10/2015 Document Reviewed: 11/04/2014 Elsevier Interactive Patient Education  2017 [employer].     IF you received an x-ray today, you will receive an invoice from [facility]. Please contact [facility] at [phone] with questions or concerns regarding your invoice.   IF you received labwork today, you will receive an invoice from [facility]. Please contact [facility] at [phone] with questions or concerns regarding your invoice.   Our billing staff will not be able to assist you with questions regarding bills from these companies.  You will be contacted with the lab results as soon as they are available. The fastest way to get your results is to activate your My Chart account. Instructions are located on the last page of this paperwork. If you have not heard from [place] regarding the results in 2 weeks, please contact this office.

## 2016-06-15 LAB — CMP14+EGFR
A/G RATIO: 1.8 (ref 1.2–2.2)
ALT: 31 IU/L (ref 0–44)
AST: 20 IU/L (ref 0–40)
Albumin: 4.5 g/dL (ref 3.5–5.5)
Alkaline Phosphatase: 63 IU/L (ref 39–117)
BUN / CREAT RATIO: 7 — AB (ref 9–20)
BUN: 9 mg/dL (ref 6–20)
Bilirubin Total: <0.2 mg/dL (ref 0.0–1.2)
CO2: 25 mmol/L (ref 18–29)
Calcium: 9.8 mg/dL (ref 8.7–10.2)
Chloride: 102 mmol/L (ref 96–106)
Creatinine, Ser: 1.31 mg/dL — ABNORMAL HIGH (ref 0.76–1.27)
GFR calc non Af Amer: 72 mL/min/{1.73_m2} (ref >59)
GFR, EST AFRICAN AMERICAN: 83 mL/min/{1.73_m2} (ref >59)
GLOBULIN, TOTAL: 2.5 g/dL (ref 1.5–4.5)
Glucose: 83 mg/dL (ref 65–99)
POTASSIUM: 4.6 mmol/L (ref 3.5–5.2)
SODIUM: 144 mmol/L (ref 134–144)
TOTAL PROTEIN: 7 g/dL (ref 6.0–8.5)

## 2016-07-05 ENCOUNTER — Ambulatory Visit: Payer: 59 | Admitting: Physician Assistant

## 2016-07-06 ENCOUNTER — Ambulatory Visit (INDEPENDENT_AMBULATORY_CARE_PROVIDER_SITE_OTHER): Payer: 59 | Admitting: Physician Assistant

## 2016-07-06 VITALS — BP 130/90 | HR 90 | Temp 98.4°F | Resp 18 | Ht 73.0 in | Wt 332.0 lb

## 2016-07-06 DIAGNOSIS — B86 Scabies: Secondary | ICD-10-CM

## 2016-07-06 DIAGNOSIS — I10 Essential (primary) hypertension: Secondary | ICD-10-CM | POA: Diagnosis not present

## 2016-07-06 DIAGNOSIS — R21 Rash and other nonspecific skin eruption: Secondary | ICD-10-CM

## 2016-07-06 LAB — POCT SKIN KOH: Skin KOH, POC: NEGATIVE

## 2016-07-06 MED ORDER — IVERMECTIN 3 MG PO TABS: ORAL_TABLET | ORAL | 0 refills | Status: DC

## 2016-07-06 MED ORDER — BETAMETHASONE DIPROPIONATE 0.05 % EX CREA: TOPICAL_CREAM | Freq: Two times a day (BID) | CUTANEOUS | 0 refills | Status: DC

## 2016-07-06 NOTE — Progress Notes (Signed)
Jack Rose  MRN: [id number] DOB: [birth year]  PCP: No PCP Per Patient  Chief Complaint  Patient presents with  . Follow-up    Scabies, rash on back    Subjective:  Pt presents to clinic for f/u BP - he has been taking his medication daily and not having any problems.  The rash on his fingers is almost gone but his girlfriend still has what she thinks is a new spot and he would like to be treated one more time to make sure it is gone.  He also has a rash on her back and left arm and some on his abd - he has been on prednisone for this rash but he did not notice much difference after the prednisone.  It is itchy - he has used no cream on it.    Review of Systems  Respiratory: Negative for cough and shortness of breath.   Cardiovascular: Negative for chest pain, palpitations and leg swelling.    Patient Active Problem List   Diagnosis Date Noted  . 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 Obesity, Class III, BMI 40-49.9 (morbid obesity) ([place]) 05/08/2015  . Benign essential HTN 05/08/2015    Current Outpatient Prescriptions on File Prior to Visit  Medication Sig Dispense Refill  . amLODipine (NORVASC) 10 MG tablet Take 1 tablet (10 mg total) by mouth daily. 90 tablet 1  . Blood Pressure Monitoring (B-D ASSURE BPM/AUTO WRIST CUFF) MISC 1 Device by Does not apply route daily. 1 each 0  . lisinopril-hydrochlorothiazide (PRINZIDE,ZESTORETIC) 20-12.5 MG tablet Take 1 tablet by mouth daily. 90 tablet 1   No current facility-administered medications on file prior to visit.     No Known Allergies  Pt patients past, family and social history were reviewed and updated.   Objective:  BP 130/90   Pulse 90   Temp 98.4 F (36.9 C) (Oral)   Resp 18   Ht 6\' 1"  (1.854 m)   Wt (!) 332 lb (150.6 kg)   SpO2 95%   BMI 43.80 kg/m   Physical Exam  Constitutional: He is oriented to person, place, and time and well-developed, well-nourished, and in no distress.  HENT:  Head:  Normocephalic and atraumatic.  Right Ear: External ear normal.  Left Ear: External ear normal.  Eyes: Conjunctivae are normal.  Neck: Normal range of motion.  Cardiovascular: Normal rate, regular rhythm and normal heart sounds.   No murmur heard. Pulmonary/Chest: Effort normal and breath sounds normal. He has no wheezes.  Neurological: He is alert and oriented to person, place, and time. Gait normal.  Skin: Skin is warm and dry.  Psychiatric: Mood, memory, affect and judgment normal.   Results for orders placed or performed in visit on 07/06/16  POCT Skin KOH  Result Value Ref Range   Skin KOH, POC Negative Negative    Assessment and Plan :  Essential hypertension -  - continue current medications - recheck in 3 months  Scabies - Plan: ivermectin (STROMECTOL) 3 MG TABS tablet - pts girlfriend is concerned they still have this - he has no lesions but she thinks that she has a few new one - this was given for 1 more repeat treatment for them boht  Skin rash - Plan: POCT Skin KOH, betamethasone dipropionate (DIPROLENE) 0.05 % cream - trial of steroid cream on his rash that he has had for the last couple of months - if this does not fix it we will do a biopsy at that  time   Windell Hummingbird PA-C  [facility] 07/11/2016 11:23 AM

## 2016-07-06 NOTE — Patient Instructions (Signed)
     IF you received an x-ray today, you will receive an invoice from [facility]. Please contact [facility] at [phone] with questions or concerns regarding your invoice.   IF you received labwork today, you will receive an invoice from [facility]. Please contact [facility] at [phone] with questions or concerns regarding your invoice.   Our billing staff will not be able to assist you with questions regarding bills from these companies.  You will be contacted with the lab results as soon as they are available. The fastest way to get your results is to activate your My Chart account. Instructions are located on the last page of this paperwork. If you have not heard from us regarding the results in 2 weeks, please contact this office.     

## 2016-07-21 ENCOUNTER — Other Ambulatory Visit: Payer: Self-pay | Admitting: Physician Assistant

## 2016-07-21 DIAGNOSIS — I10 Essential (primary) hypertension: Secondary | ICD-10-CM

## 2016-08-02 ENCOUNTER — Other Ambulatory Visit: Payer: Self-pay

## 2016-08-02 DIAGNOSIS — I10 Essential (primary) hypertension: Secondary | ICD-10-CM

## 2016-08-02 MED ORDER — LISINOPRIL-HYDROCHLOROTHIAZIDE 20-12.5 MG PO TABS: 1.0000 | ORAL_TABLET | Freq: Every day | ORAL | 1 refills | Status: DC

## 2016-08-02 MED ORDER — AMLODIPINE BESYLATE 10 MG PO TABS: 10.0000 mg | ORAL_TABLET | Freq: Every day | ORAL | 1 refills | Status: DC

## 2016-10-10 ENCOUNTER — Telehealth: Payer: Self-pay | Admitting: Family Medicine

## 2016-10-11 ENCOUNTER — Encounter: Payer: Self-pay | Admitting: Physician Assistant

## 2016-10-11 ENCOUNTER — Ambulatory Visit (INDEPENDENT_AMBULATORY_CARE_PROVIDER_SITE_OTHER): Payer: 59 | Admitting: Physician Assistant

## 2016-10-11 VITALS — BP 139/95 | HR 86 | Temp 98.1°F | Resp 18 | Ht 73.0 in | Wt 328.2 lb

## 2016-10-11 DIAGNOSIS — R21 Rash and other nonspecific skin eruption: Secondary | ICD-10-CM

## 2016-10-11 DIAGNOSIS — I10 Essential (primary) hypertension: Secondary | ICD-10-CM

## 2016-10-11 DIAGNOSIS — M7701 Medial epicondylitis, right elbow: Secondary | ICD-10-CM

## 2016-10-11 DIAGNOSIS — L299 Pruritus, unspecified: Secondary | ICD-10-CM | POA: Diagnosis not present

## 2016-10-11 DIAGNOSIS — R9431 Abnormal electrocardiogram [ECG] [EKG]: Secondary | ICD-10-CM | POA: Diagnosis not present

## 2016-10-11 MED ORDER — AMLODIPINE BESYLATE 10 MG PO TABS: 10.0000 mg | ORAL_TABLET | Freq: Every day | ORAL | 1 refills | Status: AC

## 2016-10-11 MED ORDER — CLOTRIMAZOLE-BETAMETHASONE 1-0.05 % EX CREA: 1.0000 "application " | TOPICAL_CREAM | Freq: Two times a day (BID) | CUTANEOUS | 1 refills | Status: AC

## 2016-10-11 MED ORDER — LISINOPRIL-HYDROCHLOROTHIAZIDE 20-12.5 MG PO TABS: 1.0000 | ORAL_TABLET | Freq: Every day | ORAL | 1 refills | Status: AC

## 2016-10-11 MED ORDER — HYDROXYZINE HCL 25 MG PO TABS: 25.0000 mg | ORAL_TABLET | Freq: Every evening | ORAL | 0 refills | Status: AC | PRN

## 2016-10-11 NOTE — Progress Notes (Signed)
Jack Rose  MRN: [id number] DOB: 1985-05-03  PCP: Patient, No Pcp Per  Chief Complaint  Patient presents with  . scabies    follow up   . Medication Refill    BP meds    Subjective:  Pt presents to clinic for medication f/u.  He has been out of his medications of a couple of weeks.  He does not check his BP at home.  He feels fine.  He is having some itching on his gluteal cleft - he has tried nothing - then he also has bumps on his inner thigh that itch. He has has scabies in the past and want to make sure that is not what it is due to living with others.  He has had some problems with right elbow pain - he has been lifting weights more due to a contest with his friends.  His medial elbow hurts and it is worse after he lifts weights.  He has done nothing for the pain.  Review of Systems  Respiratory: Negative for cough and shortness of breath.   Cardiovascular: Negative for chest pain, palpitations and leg swelling.    Patient Active Problem List   Diagnosis Date Noted  . 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 Obesity, Class III, BMI 40-49.9 (morbid obesity) ([place]) 05/08/2015  . Benign essential HTN 05/08/2015    Current Outpatient Prescriptions on File Prior to Visit  Medication Sig Dispense Refill  . Blood Pressure Monitoring (B-D ASSURE BPM/AUTO WRIST CUFF) MISC 1 Device by Does not apply route daily. (Patient not taking: Reported on 10/11/2016) 1 each 0   No current facility-administered medications on file prior to visit.     No Known Allergies  Pt patients past, family and social history were reviewed and updated.   Objective:  BP (!) 139/95   Pulse 86   Temp 98.1 F (36.7 C) (Oral)   Resp 18   Ht 6\' 1"  (1.854 m)   Wt (!) 328 lb 3.2 oz (148.9 kg)   SpO2 97%   BMI 43.30 kg/m   Physical Exam  Constitutional: He is oriented to person, place, and time and well-developed, well-nourished, and in no distress.  HENT:  Head: Normocephalic and atraumatic.    Right Ear: External ear normal.  Left Ear: External ear normal.  Eyes: Conjunctivae are normal.  Neck: Normal range of motion.  Cardiovascular: Normal rate, regular rhythm, normal heart sounds and intact distal pulses.   Pulmonary/Chest: Effort normal and breath sounds normal. He has no wheezes.  Musculoskeletal:       Right forearm: He exhibits tenderness (medial epicondyle - increase pain with wrist flexion and resistance and supination with resistance).       Right lower leg: He exhibits no edema.       Left lower leg: He exhibits no edema.  Neurological: He is alert and oriented to person, place, and time. Gait normal.  Skin: Skin is warm and dry.  Psychiatric: Mood, memory, affect and judgment normal.   Rhythm: sinus rhythm at a rate of 74. Findings: inverted T waves in 3 and aVF Last EKG: none  I have personally reviewed the EKG tracing and disagree with the computerized printout. - read with Dr Tamala Julian  Assessment and Plan :  Essential hypertension - Plan: EKG 12-Lead, lisinopril-hydrochlorothiazide (PRINZIDE,ZESTORETIC) 20-12.5 MG tablet, amLODipine (NORVASC) 10 MG tablet, Ambulatory referral to Cardiology - uncontrolled today due to patient being out of medications  Itching - Plan: hydrOXYzine (ATARAX/VISTARIL) 25 MG  tablet - trial of this at night to see if that will decrease his scratching  Nonspecific abnormal electrocardiogram (ECG) (EKG) - Plan: Ambulatory referral to Cardiology  Medial epicondylitis of right elbow - elbow strap and ice - pt is ok to use some motrin but he needs to monitor his BP while on this medication and I do not want him to use it to long with his BP.  Rash and nonspecific skin eruption - pt appears to have stretch marks and hyperpigmentation with dry skin in his groin and in his gluteal cleft he has some healing lesions likely ingrown irritated hairs - he will use lotrisone as that will treat both dry skin and possible fungal component though this is  not likely - I do not see any evidence of scabies at this time -   Recheck in 3 months  Windell Hummingbird PA-C  [facility] 10/11/2016 10:44 AM

## 2016-10-11 NOTE — Patient Instructions (Signed)
     IF you received an x-ray today, you will receive an invoice from [facility]. Please contact [facility] at [phone] with questions or concerns regarding your invoice.   IF you received labwork today, you will receive an invoice from [facility]. Please contact [facility] at [phone] with questions or concerns regarding your invoice.   Our billing staff will not be able to assist you with questions regarding bills from these companies.  You will be contacted with the lab results as soon as they are available. The fastest way to get your results is to activate your My Chart account. Instructions are located on the last page of this paperwork. If you have not heard from us regarding the results in 2 weeks, please contact this office.     

## 2016-10-21 ENCOUNTER — Ambulatory Visit: Payer: Self-pay | Admitting: Cardiovascular Disease

## 2016-11-04 ENCOUNTER — Telehealth: Payer: Self-pay | Admitting: Physician Assistant

## 2016-11-11 ENCOUNTER — Ambulatory Visit (INDEPENDENT_AMBULATORY_CARE_PROVIDER_SITE_OTHER): Payer: 59 | Admitting: Cardiovascular Disease

## 2016-11-11 ENCOUNTER — Encounter: Payer: Self-pay | Admitting: Cardiovascular Disease

## 2016-11-11 DIAGNOSIS — I10 Essential (primary) hypertension: Secondary | ICD-10-CM

## 2016-11-11 DIAGNOSIS — R9431 Abnormal electrocardiogram [ECG] [EKG]: Secondary | ICD-10-CM

## 2016-11-11 NOTE — Progress Notes (Signed)
     11/11/2016 Jack Rose   [birth year]  [id number]  Primary Physician Gale Journey Damaris Hippo, PA-C Primary Cardiologist: Lorretta Harp MD Lupe Carney, [place]  HPI:  Jack Rose is a 31 y.o. 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 He was referred by Windell Hummingbird [facility] for cardiovascular evaluation because of an abnormal EKG. He does have a history of hypertension. No other risk factors previous biliary symptomatic. He does have symptoms compatible with obstructive sleep apnea but has not yet been evaluated. His EKG showed T-wave inversion in leads 3 and F. He also has early repolarization changes.   Current Meds  Medication Sig  . amLODipine (NORVASC) 10 MG tablet Take 1 tablet (10 mg total) by mouth daily.  . Blood Pressure Monitoring (B-D ASSURE BPM/AUTO WRIST CUFF) MISC 1 Device by Does not apply route daily.  . clotrimazole-betamethasone (LOTRISONE) cream Apply 1 application topically 2 (two) times daily.  . hydrOXYzine (ATARAX/VISTARIL) 25 MG tablet Take 1 tablet (25 mg total) by mouth at bedtime as needed for itching.  Marland Kitchen lisinopril-hydrochlorothiazide (PRINZIDE,ZESTORETIC) 20-12.5 MG tablet Take 1 tablet by mouth daily.     No Known Allergies  Social History   Social History  . Marital status: Significant Other    Spouse name: N/A  . Number of children: N/A  . Years of education: N/A   Occupational History  . Not on file.   Social History Main Topics  . Smoking status: Never Smoker  . Smokeless tobacco: Never Used  . Alcohol use Yes  . Drug use: No  . Sexual activity: Not on file   Other Topics Concern  . Not on file   Social History Narrative  . No narrative on file     Review of Systems: General: negative for chills, fever, night sweats or weight changes.  Cardiovascular: negative for chest pain, dyspnea on exertion, edema, orthopnea, palpitations, paroxysmal nocturnal dyspnea or  shortness of breath Dermatological: negative for rash Respiratory: negative for cough or wheezing Urologic: negative for hematuria Abdominal: negative for nausea, vomiting, diarrhea, bright red blood per rectum, melena, or hematemesis Neurologic: negative for visual changes, syncope, or dizziness All other systems reviewed and are otherwise negative except as noted above.    Blood pressure (!) 140/92, pulse 84, height 6\' 1"  (1.854 m), weight (!) 329 lb (149.2 kg).  General appearance: alert and no distress Neck: no adenopathy, no carotid bruit, no JVD, supple, symmetrical, trachea midline and thyroid not enlarged, symmetric, no tenderness/mass/nodules Lungs: clear to auscultation bilaterally Heart: regular rate and rhythm, S1, S2 normal, no murmur, click, rub or gallop Extremities: extremities normal, atraumatic, no cyanosis or edema  EKG sinus rhythm at 84 with T-wave inversion in lead 3 otherwise normal. I personally reviewed this EKG.  ASSESSMENT AND PLAN:   Abnormal EKG Mr. Pechacek was referred to me by Windell Hummingbird [facility] for evaluation of an abnormal EKG. His 12-lead EKG shows T-wave inversion in the inferior leads. He otherwise has no risk factors and is asymptomatic. I'm not convinced that these are ischemically mediated probably he is "normal" EKG.  Benign essential HTN History of essential hypertension blood pressure measured 140/92. He is on lisinopril, hydrochlorothiazide and amlodipine. He does admit to dietary indiscretion with regards to salt.      Lorretta Harp MD FACP,FACC,FAHA, [facility] 11/11/2016 3:51 PM

## 2016-11-11 NOTE — Patient Instructions (Signed)
Medication Instructions: Your physician recommends that you continue on your current medications as directed. Please refer to the Current Medication list given to you today.   Follow-Up: Your physician recommends that you schedule a follow-up appointment as needed with Dr. Berry.    

## 2016-11-11 NOTE — Assessment & Plan Note (Signed)
History of essential hypertension blood pressure measured 140/92. He is on lisinopril, hydrochlorothiazide and amlodipine. He does admit to dietary indiscretion with regards to salt.

## 2016-11-11 NOTE — Assessment & Plan Note (Signed)
Mr. Jack Rose was referred to me by Windell Hummingbird [facility] for evaluation of an abnormal EKG. His 12-lead EKG shows T-wave inversion in the inferior leads. He otherwise has no risk factors and is asymptomatic. I'm not convinced that these are ischemically mediated probably he is "normal" EKG.

## 2016-12-13 ENCOUNTER — Ambulatory Visit: Payer: 59 | Admitting: Physician Assistant

## 2017-01-10 ENCOUNTER — Ambulatory Visit: Payer: 59 | Admitting: Physician Assistant

## 2022-11-21 ENCOUNTER — Emergency Department (HOSPITAL_BASED_OUTPATIENT_CLINIC_OR_DEPARTMENT_OTHER)
Admission: EM | Admit: 2022-11-21 | Discharge: 2022-11-21 | Disposition: A | Discharge status: Home / Self Care | Payer: 59 | Attending: Emergency Medicine | Admitting: Emergency Medicine

## 2022-11-21 ENCOUNTER — Other Ambulatory Visit: Payer: Self-pay

## 2022-11-21 ENCOUNTER — Emergency Department (HOSPITAL_BASED_OUTPATIENT_CLINIC_OR_DEPARTMENT_OTHER): Payer: 59 | Admitting: Radiology

## 2022-11-21 ENCOUNTER — Encounter (HOSPITAL_BASED_OUTPATIENT_CLINIC_OR_DEPARTMENT_OTHER): Payer: Self-pay

## 2022-11-21 DIAGNOSIS — R200 Anesthesia of skin: Secondary | ICD-10-CM | POA: Diagnosis present

## 2022-11-21 DIAGNOSIS — I10 Essential (primary) hypertension: Secondary | ICD-10-CM | POA: Diagnosis not present

## 2022-11-21 LAB — CBC WITH DIFFERENTIAL/PLATELET
Abs Immature Granulocytes: 0.03 10*3/uL (ref 0.00–0.07)
Basophils Absolute: 0 10*3/uL (ref 0.0–0.1)
Basophils Relative: 0 %
Eosinophils Absolute: 0.2 10*3/uL (ref 0.0–0.5)
Eosinophils Relative: 3 %
HCT: 51.7 % (ref 39.0–52.0)
Hemoglobin: 16.8 g/dL (ref 13.0–17.0)
Immature Granulocytes: 0 %
Lymphocytes Relative: 26 %
Lymphs Abs: 1.9 10*3/uL (ref 0.7–4.0)
MCH: 26.3 pg (ref 26.0–34.0)
MCHC: 32.5 g/dL (ref 30.0–36.0)
MCV: 81 fL (ref 80.0–100.0)
Monocytes Absolute: 0.4 10*3/uL (ref 0.1–1.0)
Monocytes Relative: 5 %
Neutro Abs: 4.6 10*3/uL (ref 1.7–7.7)
Neutrophils Relative %: 66 %
Platelets: 312 10*3/uL (ref 150–400)
RBC: 6.38 MIL/uL — ABNORMAL HIGH (ref 4.22–5.81)
RDW: 13.1 % (ref 11.5–15.5)
WBC: 7.1 10*3/uL (ref 4.0–10.5)
nRBC: 0 % (ref 0.0–0.2)

## 2022-11-21 LAB — COMPREHENSIVE METABOLIC PANEL
ALT: 9 U/L (ref 0–44)
AST: 13 U/L — ABNORMAL LOW (ref 15–41)
Albumin: 4.7 g/dL (ref 3.5–5.0)
Alkaline Phosphatase: 53 U/L (ref 38–126)
Anion gap: 9 (ref 5–15)
BUN: 11 mg/dL (ref 6–20)
CO2: 26 mmol/L (ref 22–32)
Calcium: 9.8 mg/dL (ref 8.9–10.3)
Chloride: 106 mmol/L (ref 98–111)
Creatinine, Ser: 1.39 mg/dL — ABNORMAL HIGH (ref 0.61–1.24)
GFR, Estimated: >60 mL/min (ref >60)
Glucose, Bld: 93 mg/dL (ref 70–99)
Potassium: 4.1 mmol/L (ref 3.5–5.1)
Sodium: 141 mmol/L (ref 135–145)
Total Bilirubin: 0.5 mg/dL (ref 0.3–1.2)
Total Protein: 8 g/dL (ref 6.5–8.1)

## 2022-11-21 LAB — TROPONIN I (HIGH SENSITIVITY)
Troponin I (High Sensitivity): 3 ng/L (ref <18)
Troponin I (High Sensitivity): 3 ng/L (ref <18)

## 2022-11-21 NOTE — ED Triage Notes (Signed)
Pt to ED from [facility] via [facility] c/o HTN and tingling to left arm x 5 days. Reports hx of HTN. Compliant with medication regimen. No meds given by EMS   Last VS: 180/100, P 96, 98%RA

## 2022-11-21 NOTE — ED Notes (Signed)
 RN reviewed discharge instructions with pt. Pt verbalized understanding and had no further questions. VSS upon discharge.  

## 2022-11-21 NOTE — ED Notes (Signed)
Pt reports LT arm "tingling" x 2 weeks. Deneis HA or weakness. Bilaterally equal

## 2022-11-21 NOTE — Discharge Instructions (Addendum)
It was a pleasure caring for you today.  I recommend following up with your primary care provider.  Seek emergency care if experiencing any new or worsening symptoms.

## 2022-11-21 NOTE — ED Notes (Signed)
Pt discharged home and given discharge paperwork. Opportunities given for questions. Pt verbalizes understanding. Jillyn Hidden , RN

## 2022-11-21 NOTE — ED Provider Notes (Signed)
[place] EMERGENCY DEPARTMENT AT [facility] Provider Note   CSN: [id number] Arrival date & time: 11/21/22  1309     History  Chief Complaint  Patient presents with   Hypertension   Numbness    LEFT ARM    Jack Rose is a 37 y.o. male PMHx herniated disc who presented to ED concerned for HTN and left arm tingling x5days. Patient states that left arm becomes tingling when laying in a position for an extended period of time.  Denies fever, chest pain, dyspnea, cough, abdominal pain, nausea, vomiting, diarrhea. Denies loss of consciousness, vision changes, headache.   Hypertension       Home Medications Prior to Admission medications   Medication Sig Start Date End Date Taking? Authorizing Provider  amLODipine (NORVASC) 10 MG tablet Take 1 tablet (10 mg total) by mouth daily. 10/11/16   Weber, Dema Severin, PA-C  Blood Pressure Monitoring (B-D ASSURE BPM/AUTO WRIST CUFF) MISC 1 Device by Does not apply route daily. 05/08/15   Weber, Dema Severin, PA-C  clotrimazole-betamethasone (LOTRISONE) cream Apply 1 application topically 2 (two) times daily. 10/11/16   Valarie Cones, Dema Severin, PA-C  hydrOXYzine (ATARAX/VISTARIL) 25 MG tablet Take 1 tablet (25 mg total) by mouth at bedtime as needed for itching. 10/11/16   Weber, Dema Severin, PA-C  lisinopril-hydrochlorothiazide (PRINZIDE,ZESTORETIC) 20-12.5 MG tablet Take 1 tablet by mouth daily. 10/11/16   Valarie Cones, Dema Severin, PA-C      Allergies    Patient has no known allergies.    Review of Systems   Review of Systems  Musculoskeletal:        HTN Left arm tingling    Physical Exam Updated Vital Signs BP (!) 139/91   Pulse 64   Temp 99.5 F (37.5 C) (Oral)   Resp 18   Ht 6\' 2"  (1.88 m)   Wt (!) 141.1 kg   SpO2 98%   BMI 39.93 kg/m  Physical Exam Vitals and nursing note reviewed.  Constitutional:      General: He is not in acute distress.    Appearance: He is not ill-appearing or toxic-appearing.  HENT:     Head: Normocephalic and  atraumatic.     Mouth/Throat:     Mouth: Mucous membranes are moist.  Eyes:     General: No scleral icterus.       Right eye: No discharge.        Left eye: No discharge.     Conjunctiva/sclera: Conjunctivae normal.  Cardiovascular:     Rate and Rhythm: Normal rate and regular rhythm.     Pulses: Normal pulses.     Heart sounds: Normal heart sounds. No murmur heard. Pulmonary:     Effort: Pulmonary effort is normal. No respiratory distress.     Breath sounds: Normal breath sounds. No wheezing, rhonchi or rales.  Abdominal:     General: Abdomen is flat. Bowel sounds are normal.     Palpations: Abdomen is soft.     Tenderness: There is no abdominal tenderness.  Musculoskeletal:     Right lower leg: No edema.     Left lower leg: No edema.  Skin:    General: Skin is warm and dry.     Capillary Refill: Capillary refill takes less than 2 seconds.     Findings: No rash.  Neurological:     General: No focal deficit present.     Mental Status: He is alert and oriented to person, place, and time. Mental status is at  baseline.     Comments: GCS 15. Speech is goal oriented. No deficits appreciated to CN III-XII; symmetric eyebrow raise, no facial drooping, tongue midline. Patient has equal grip strength bilaterally with 5/5 strength against resistance in all major muscle groups bilaterally. Sensation to light touch intact. Patient moves extremities without ataxia. Patient ambulatory with steady gait.   Psychiatric:        Mood and Affect: Mood normal.        Behavior: Behavior normal.     ED Results / Procedures / Treatments   Labs (all labs ordered are listed, but only abnormal results are displayed) Labs Reviewed  COMPREHENSIVE METABOLIC PANEL - Abnormal; Notable for the following components:      Result Value   Creatinine, Ser 1.39 (*)    AST 13 (*)    All other components within normal limits  CBC WITH DIFFERENTIAL/PLATELET - Abnormal; Notable for the following components:    RBC 6.38 (*)    All other components within normal limits  TROPONIN I (HIGH SENSITIVITY)  TROPONIN I (HIGH SENSITIVITY)    EKG EKG Interpretation Date/Time:  Monday November 21 2022 13:24:18 EDT Ventricular Rate:  77 PR Interval:  148 QRS Duration:  102 QT Interval:  378 QTC Calculation: 427 R Axis:   -29  Text Interpretation: Normal sinus rhythm Incomplete right bundle branch block Septal infarct , age undetermined Abnormal ECG No previous ECGs available No stemi Confirmed by Alvino Blood ([id number]) on 11/21/2022 3:19:18 PM  Radiology DG Chest 2 View  Result Date: 11/21/2022 CLINICAL DATA:  37 year old male with history of left arm tingling for the past 5 days. History of hypertension. EXAM: CHEST - 2 VIEW COMPARISON:  No priors. FINDINGS: Lung volumes are normal. No consolidative airspace disease. No pleural effusions. No pneumothorax. No pulmonary nodule or mass noted. Pulmonary vasculature and the cardiomediastinal silhouette are within normal limits. IMPRESSION: No radiographic evidence of acute cardiopulmonary disease. Electronically Signed   By: Trudie Reed M.D.   On: 11/21/2022 14:21    Procedures Procedures    Medications Ordered in ED Medications - No data to display  ED Course/ Medical Decision Making/ A&P                                 Medical Decision Making  This patient presents to the ED for concern of HTN and arm tingling, this involves an extensive number of treatment options, and is a complaint that carries with it a high risk of complications and morbidity.  The differential diagnosis includes HTN, stroke, HTN emergency   Co morbidities that complicate the patient evaluation  HTN, herniated disc   Lab Tests:  I Ordered, and personally interpreted labs.  The pertinent results include:   -troponin: initial and repeat within normal limits -CMP:no concern for electrolyte abnormality; no concern for kidney/liver damage -CBC: No concern for anemia or  leukocytosis   Imaging Studies ordered:  I ordered imaging studies including  -chest xray: to assess for process contributing to patient's symptoms  I independently visualized and interpreted imaging I agree with the radiologist interpretation   Cardiac Monitoring: / EKG:  The patient was maintained on a cardiac monitor.  I personally viewed and interpreted the cardiac monitored which showed an underlying rhythm of: no STEMI or arrythmias   Problem List / ED Course / Critical interventions / Medication management  Patient presents to ED concerned for HTN and left arm  tingling x5 days. Patient denying any concerning symptoms of HTN emergency such as headache, vision changes, chest pain, dyspnea.  BP 139/91 mmHg in ED without intervention. Patient afebrile with stable vitals.  CBC and CMP reassuring. EKG without concern for STEMI or arrhythmias. Chest xray without acute cardiopulmonary disease. Delta troponin negative.  Physical and neuro exam unremarkable. No neuro deficits - less concerning for stroke.  Patient without tingling in left arm during interview. Patient stating that arm tingling happened when laying back getting haircut. Educated patient that he may have some sort of nerve entrapment issue such as cubital tunnel syndrome since tingling happens when in specific positions for extended period of time. Recommended following up with PCP. Provided patient with information for PCP. Patient verbalized understanding of plan. I have reviewed the patients home medicines and have made adjustments as needed Provided with return precautions. Discharged in good condition.   Social Determinants of Health:  none          Final Clinical Impression(s) / ED Diagnoses Final diagnoses:  Primary hypertension    Rx / DC Orders ED Discharge Orders     None         Margarita Rana 11/22/22 2013    Lonell Grandchild, MD 12/01/22 1606

## 2022-12-30 ENCOUNTER — Other Ambulatory Visit (HOSPITAL_BASED_OUTPATIENT_CLINIC_OR_DEPARTMENT_OTHER): Payer: Self-pay | Admitting: Physician Assistant

## 2022-12-30 DIAGNOSIS — R319 Hematuria, unspecified: Secondary | ICD-10-CM
